# Patient Record
Sex: Female | Born: 1995 | Race: Black or African American | Hispanic: No | Marital: Single | Smoking: Never smoker
Health system: Southern US, Community
[De-identification: ages and names within clinical notes are randomized; demographics above are authoritative.]

## PROBLEM LIST (undated history)

## (undated) DIAGNOSIS — O039 Complete or unspecified spontaneous abortion without complication: Secondary | ICD-10-CM

## (undated) HISTORY — PX: ADENOIDECTOMY: SUR15

---

## 2017-02-09 ENCOUNTER — Encounter: Payer: Self-pay | Admitting: Emergency Medicine

## 2017-02-09 ENCOUNTER — Emergency Department
Admission: EM | Admit: 2017-02-09 | Discharge: 2017-02-09 | Disposition: A | Discharge status: Home / Self Care | Payer: Medicaid Other | Attending: Emergency Medicine | Admitting: Emergency Medicine

## 2017-02-09 ENCOUNTER — Emergency Department: Payer: Medicaid Other

## 2017-02-09 DIAGNOSIS — Y92410 Unspecified street and highway as the place of occurrence of the external cause: Secondary | ICD-10-CM | POA: Diagnosis not present

## 2017-02-09 DIAGNOSIS — Y999 Unspecified external cause status: Secondary | ICD-10-CM | POA: Diagnosis not present

## 2017-02-09 DIAGNOSIS — S199XXA Unspecified injury of neck, initial encounter: Secondary | ICD-10-CM | POA: Diagnosis present

## 2017-02-09 DIAGNOSIS — Y939 Activity, unspecified: Secondary | ICD-10-CM | POA: Diagnosis not present

## 2017-02-09 DIAGNOSIS — S161XXA Strain of muscle, fascia and tendon at neck level, initial encounter: Secondary | ICD-10-CM | POA: Insufficient documentation

## 2017-02-09 DIAGNOSIS — G44319 Acute post-traumatic headache, not intractable: Secondary | ICD-10-CM | POA: Insufficient documentation

## 2017-02-09 MED ORDER — CYCLOBENZAPRINE HCL 10 MG PO TABS: 10.0000 mg | ORAL_TABLET | Freq: Three times a day (TID) | ORAL | 0 refills | Status: DC | PRN

## 2017-02-09 MED ORDER — KETOROLAC TROMETHAMINE 30 MG/ML IJ SOLN
30.0000 mg | Freq: Once | INTRAMUSCULAR | Status: AC
  Administered 2017-02-09: 30 mg via INTRAMUSCULAR
  Filled 2017-02-09: qty 1

## 2017-02-09 MED ORDER — ORPHENADRINE CITRATE 30 MG/ML IJ SOLN
60.0000 mg | Freq: Once | INTRAMUSCULAR | Status: AC
  Administered 2017-02-09: 60 mg via INTRAMUSCULAR
  Filled 2017-02-09: qty 2

## 2017-02-09 MED ORDER — NAPROXEN 500 MG PO TABS: 500.0000 mg | ORAL_TABLET | Freq: Two times a day (BID) | ORAL | 0 refills | Status: DC
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
## 2017-02-09 NOTE — ED Notes (Signed)
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
## 2017-02-11 ENCOUNTER — Emergency Department
Admission: EM | Admit: 2017-02-11 | Discharge: 2017-02-11 | Disposition: A | Discharge status: Home / Self Care | Payer: Medicaid Other | Attending: Emergency Medicine | Admitting: Emergency Medicine

## 2017-02-11 ENCOUNTER — Encounter: Payer: Self-pay | Admitting: Emergency Medicine

## 2017-02-11 DIAGNOSIS — Y999 Unspecified external cause status: Secondary | ICD-10-CM | POA: Diagnosis not present

## 2017-02-11 DIAGNOSIS — Y92410 Unspecified street and highway as the place of occurrence of the external cause: Secondary | ICD-10-CM | POA: Insufficient documentation

## 2017-02-11 DIAGNOSIS — S161XXA Strain of muscle, fascia and tendon at neck level, initial encounter: Secondary | ICD-10-CM | POA: Insufficient documentation

## 2017-02-11 DIAGNOSIS — S199XXA Unspecified injury of neck, initial encounter: Secondary | ICD-10-CM | POA: Diagnosis present

## 2017-02-11 DIAGNOSIS — Y939 Activity, unspecified: Secondary | ICD-10-CM | POA: Insufficient documentation

## 2017-02-11 MED ORDER — IBUPROFEN 600 MG PO TABS: 600.0000 mg | ORAL_TABLET | Freq: Three times a day (TID) | ORAL | 0 refills | Status: DC | PRN

## 2017-02-11 NOTE — ED Notes (Signed)
Pt verbalized understanding of discharge instructions. NAD at this time. 
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
## 2017-02-11 NOTE — Discharge Instructions (Signed)
Call and [facility]. Continue taking ibuprofen 600 mg 3 times a day with food. Use ice or heat to your neck as needed for comfort. Continued to move and drink lots of water to decrease the amount of soreness in your muscles.

## 2018-05-07 ENCOUNTER — Emergency Department
Admission: EM | Admit: 2018-05-07 | Discharge: 2018-05-08 | Disposition: A | Discharge status: Home / Self Care | Payer: Medicaid Other | Attending: Emergency Medicine | Admitting: Emergency Medicine

## 2018-05-07 ENCOUNTER — Other Ambulatory Visit: Payer: Self-pay

## 2018-05-07 ENCOUNTER — Encounter: Payer: Self-pay | Admitting: Emergency Medicine

## 2018-05-07 DIAGNOSIS — O209 Hemorrhage in early pregnancy, unspecified: Secondary | ICD-10-CM | POA: Diagnosis not present

## 2018-05-07 DIAGNOSIS — Z3A00 Weeks of gestation of pregnancy not specified: Secondary | ICD-10-CM | POA: Insufficient documentation

## 2018-05-07 LAB — CBC WITH DIFFERENTIAL/PLATELET
BASOS ABS: 0 10*3/uL (ref 0–0.1)
BASOS PCT: 1 %
EOS ABS: 0.1 10*3/uL (ref 0–0.7)
Eosinophils Relative: 1 %
HEMATOCRIT: 38.4 % (ref 35.0–47.0)
Hemoglobin: 13.1 g/dL (ref 12.0–16.0)
Lymphocytes Relative: 38 %
Lymphs Abs: 2.7 10*3/uL (ref 1.0–3.6)
MCH: 30.4 pg (ref 26.0–34.0)
MCHC: 33.9 g/dL (ref 32.0–36.0)
MCV: 89.5 fL (ref 80.0–100.0)
MONO ABS: 0.5 10*3/uL (ref 0.2–0.9)
Monocytes Relative: 6 %
NEUTROS ABS: 4 10*3/uL (ref 1.4–6.5)
Neutrophils Relative %: 54 %
PLATELETS: 225 10*3/uL (ref 150–440)
RBC: 4.3 MIL/uL (ref 3.80–5.20)
RDW: 13.4 % (ref 11.5–14.5)
WBC: 7.3 10*3/uL (ref 3.6–11.0)

## 2018-05-07 LAB — COMPREHENSIVE METABOLIC PANEL
ALBUMIN: 4.5 g/dL (ref 3.5–5.0)
ALT: 9 U/L — ABNORMAL LOW (ref 14–54)
AST: 18 U/L (ref 15–41)
Alkaline Phosphatase: 55 U/L (ref 38–126)
Anion gap: 9 (ref 5–15)
BILIRUBIN TOTAL: 1.3 mg/dL — AB (ref 0.3–1.2)
BUN: 5 mg/dL — AB (ref 6–20)
CHLORIDE: 104 mmol/L (ref 101–111)
CO2: 26 mmol/L (ref 22–32)
Calcium: 9.6 mg/dL (ref 8.9–10.3)
Creatinine, Ser: 0.64 mg/dL (ref 0.44–1.00)
GFR calc Af Amer: >60 mL/min (ref >60)
GFR calc non Af Amer: >60 mL/min (ref >60)
GLUCOSE: 113 mg/dL — AB (ref 65–99)
POTASSIUM: 3.4 mmol/L — AB (ref 3.5–5.1)
SODIUM: 139 mmol/L (ref 135–145)
Total Protein: 8.1 g/dL (ref 6.5–8.1)

## 2018-05-07 NOTE — ED Notes (Signed)
Pt to the er for vaginal bleeding x 3 weeks. Pt is no

## 2018-05-07 NOTE — ED Triage Notes (Addendum)
Patient ambulatory to triage with steady gait, without difficulty or distress noted; pt reports vag bleeding x 3 wks; miscarriage 2mos ago

## 2018-05-07 NOTE — ED Provider Notes (Signed)
[facility] Emergency Department Provider Note  ____________________________________________  Time seen: Approximately 11:24 PM  I have reviewed the triage vital signs and the nursing notes.   HISTORY  Chief Complaint Vaginal Bleeding    HPI Brittany Boone is a 22 y.o. female who presents to the emergency department for treatment and evaluation of vaginal bleeding for the past  3 weeks.  Patient had a spontaneous abortion in April and was [facility].  She was given misoprostol.  She states that she had stopped bleeding until 3 weeks ago.  The amount of bleeding varies.  At times, the bleeding is scant.  She states that it seems the bleeding is worse at night.  She has passed some small clots but nothing very large.  Blood is dark red.  She denies any abdominal pain or cramping.  She denies any new or changes in medication.  She has not had a new sexual partner.  She is using condoms for birth control.  History reviewed. No pertinent past medical history.  There are no active problems to display for this patient.   Past Surgical History:  Procedure Laterality Date  . ADENOIDECTOMY      Prior to Admission medications   Medication Sig Start Date End Date Taking? Authorizing Provider  ibuprofen (ADVIL,MOTRIN) 600 MG tablet Take 1 tablet (600 mg total) by mouth every 8 (eight) hours as needed. 02/11/17   Tommi RumpsSummers, Rhonda L, PA-C    Allergies Patient has no known allergies.  No family history on file.  Social History Social History   Tobacco Use  . Smoking status: Never Smoker  . Smokeless tobacco: Never Used  Substance Use Topics  . Alcohol use: No  . Drug use: Yes    Types: Marijuana    Comment: 1-2x per day.    Review of Systems Constitutional: Negative for fever. Respiratory: Negative for shortness of breath or cough. Gastrointestinal: Negative for abdominal pain; negative for nausea , negative for  vomiting. Genitourinary: Negative for dysuria , negative for vaginal discharge.  Positive for vaginal bleeding. Musculoskeletal: Negative for back pain. Skin: Negative for acute skin changes/rash/lesion. ____________________________________________   PHYSICAL EXAM:  VITAL SIGNS: ED Triage Vitals [05/07/18 2202]  Enc Vitals Group     BP 112/80     Pulse Rate 81     Resp 18     Temp 98 F (36.7 C)     Temp Source Oral     SpO2 100 %     Weight 111 lb (50.3 kg)     Height 5\' 3"  (1.6 m)     Head Circumference      Peak Flow      Pain Score 0     Pain Loc      Pain Edu?      Excl. in GC?     Constitutional: Alert and oriented. Well appearing and in no acute distress. Eyes: Conjunctivae are normal. Head: Atraumatic. Nose: No congestion/rhinnorhea. Mouth/Throat: Mucous membranes are moist. Respiratory: Normal respiratory effort.  No retractions. Gastrointestinal: Bowel sounds active x 4; Abdomen is soft without rebound or guarding. Genitourinary: Pelvic exam: Cervix appears healthy.  Cervical os is closed.  No discharge noted.  There is a small amount of dark red blood in the vaginal vault.  Bimanual exam is normal Musculoskeletal: No extremity tenderness nor edema.  Neurologic:  Normal speech and language. No gross focal neurologic deficits are appreciated. Speech is normal. No gait instability. Skin:  Skin  is warm, dry and intact. No rash noted on exposed skin. Psychiatric: Mood and affect are normal. Speech and behavior are normal.  ____________________________________________   LABS (all labs ordered are listed, but only abnormal results are displayed)  Labs Reviewed  WET PREP, GENITAL - Abnormal; Notable for the following components:      Result Value   WBC, Wet Prep HPF POC FEW (*)    All other components within normal limits  URINALYSIS, COMPLETE (UACMP) WITH MICROSCOPIC - Abnormal; Notable for the following components:   Color, Urine RED (*)    APPearance CLOUDY  (*)    Hgb urine dipstick LARGE (*)    Protein, ur 100 (*)    Leukocytes, UA TRACE (*)    RBC / HPF >50 (*)    All other components within normal limits  COMPREHENSIVE METABOLIC PANEL - Abnormal; Notable for the following components:   Potassium 3.4 (*)    Glucose, Bld 113 (*)    BUN 5 (*)    ALT 9 (*)    Total Bilirubin 1.3 (*)    All other components within normal limits  HCG, QUANTITATIVE, PREGNANCY - Abnormal; Notable for the following components:   hCG, Beta Chain, Quant, S 455 (*)    All other components within normal limits  POCT PREGNANCY, URINE - Abnormal; Notable for the following components:   Preg Test, Ur POSITIVE (*)    All other components within normal limits  CHLAMYDIA/NGC RT PCR ([facility] ONLY)  CBC WITH DIFFERENTIAL/PLATELET  ABO/RH   ____________________________________________  RADIOLOGY  Pending ____________________________________________  Procedures  ____________________________________________  22 year old female who presents to the emergency department for treatment and evaluation of painless vaginal bleeding.  She states that there is a possibility that she could be pregnant.  Although she states that she has been bleeding for 3 weeks, she is not feeling weak or dizzy.  She has no known history of anemia.  Labs and ultrasound will be completed.  ----------------------------------------- 1:36 AM on 05/08/2018 -----------------------------------------  Patient was notified that the urine pregnancy test was positive.  ABO and Rh has been ordered.  Beta hCG is still pending. OB ultrasound requested.   ----------------------------------------- 2:28 AM on 05/08/2018 -----------------------------------------  Patient care transferred to Dr. Manson Passey.  He will follow up on the ultrasound and discuss the plan of care with patient.   INITIAL IMPRESSION / ASSESSMENT AND PLAN / ED COURSE  Pertinent labs & imaging results that were available during  my care of the patient were reviewed by me and considered in my medical decision making (see chart for details).  ____________________________________________   FINAL CLINICAL IMPRESSION(S) / ED DIAGNOSES  Final diagnoses:  Vaginal bleeding in pregnancy, first trimester    Note:  This document was prepared using Dragon voice recognition software and may include unintentional dictation errors.    Chinita Pester, FNP 05/08/18 2018    Darci Current, MD 05/08/18 [phone]

## 2018-05-08 ENCOUNTER — Other Ambulatory Visit: Payer: Self-pay

## 2018-05-08 ENCOUNTER — Encounter: Payer: Self-pay | Admitting: Radiology

## 2018-05-08 ENCOUNTER — Emergency Department: Payer: Medicaid Other

## 2018-05-08 LAB — CHLAMYDIA/NGC RT PCR (ARMC ONLY)
CHLAMYDIA TR: NOT DETECTED
N GONORRHOEAE: NOT DETECTED

## 2018-05-08 LAB — URINALYSIS, COMPLETE (UACMP) WITH MICROSCOPIC
BACTERIA UA: NONE SEEN
Bilirubin Urine: NEGATIVE
GLUCOSE, UA: NEGATIVE mg/dL
KETONES UR: NEGATIVE mg/dL
NITRITE: NEGATIVE
PH: 6 (ref 5.0–8.0)
PROTEIN: 100 mg/dL — AB
RBC / HPF: >50 RBC/hpf — ABNORMAL HIGH (ref 0–5)
Specific Gravity, Urine: 1.019 (ref 1.005–1.030)

## 2018-05-08 LAB — WET PREP, GENITAL
CLUE CELLS WET PREP: NONE SEEN
Sperm: NONE SEEN
TRICH WET PREP: NONE SEEN
Yeast Wet Prep HPF POC: NONE SEEN

## 2018-05-08 LAB — POCT PREGNANCY, URINE: Preg Test, Ur: POSITIVE — AB

## 2018-05-08 LAB — ABO/RH: ABO/RH(D): O POS

## 2018-05-08 LAB — HCG, QUANTITATIVE, PREGNANCY: hCG, Beta Chain, Quant, S: 455 m[IU]/mL — ABNORMAL HIGH (ref <5)

## 2018-05-08 NOTE — ED Notes (Signed)
Patient transported to Ultrasound 

## 2018-05-08 NOTE — ED Provider Notes (Signed)
I assumed care of the patient from Triplett NP at 2:00 AM.  Patient presented with vaginal bleeding with new diagnosis of pregnancy blood type a positive hCG quantitative 455.  Ultrasound will be did not reveal any evidence of an intrauterine pregnancy which may be secondary to early gestation versus potential other etiologies.  Spoke with the patient at length regarding the possibility of impending miscarriage versus other etiologies of first trimester bleeding and stressed the importance of the patient follow-up with OB/GYN.   Darci CurrentBrown, Barton N, MD 05/08/18 [phone]

## 2018-05-08 NOTE — ED Notes (Signed)
Blood sent to lab

## 2018-05-10 ENCOUNTER — Other Ambulatory Visit: Payer: Self-pay

## 2018-05-10 ENCOUNTER — Encounter: Payer: Self-pay | Admitting: Obstetrics and Gynecology

## 2018-05-10 ENCOUNTER — Emergency Department
Admission: EM | Admit: 2018-05-10 | Discharge: 2018-05-10 | Disposition: A | Discharge status: Home / Self Care | Payer: Medicaid Other | Attending: Emergency Medicine | Admitting: Emergency Medicine

## 2018-05-10 DIAGNOSIS — E349 Endocrine disorder, unspecified: Secondary | ICD-10-CM

## 2018-05-10 DIAGNOSIS — N938 Other specified abnormal uterine and vaginal bleeding: Secondary | ICD-10-CM | POA: Diagnosis not present

## 2018-05-10 DIAGNOSIS — Z3202 Encounter for pregnancy test, result negative: Secondary | ICD-10-CM | POA: Insufficient documentation

## 2018-05-10 DIAGNOSIS — N939 Abnormal uterine and vaginal bleeding, unspecified: Secondary | ICD-10-CM

## 2018-05-10 LAB — HCG, QUANTITATIVE, PREGNANCY: HCG, BETA CHAIN, QUANT, S: 253 m[IU]/mL — AB (ref <5)

## 2018-05-10 NOTE — ED Notes (Signed)
Pt reports she is here for beta redraw test and has some slight spotting

## 2018-05-10 NOTE — ED Provider Notes (Signed)
[facility] Emergency Department Provider Note  ____________________________________________  Time seen: Approximately 3:36 PM  I have reviewed the triage vital signs and the nursing notes.   HISTORY  Chief Complaint Follow-up    HPI Brittany Boone is a 22 y.o. female who presents the emergency department for repeat hCG.  Patient was seen 3 days ago, had extensive work-up.  At that time, patient's ECG showed that she was pregnant, no viable intrauterine pregnancy was identified on ultrasound.  Patient had unexplained vaginal bleeding for 3 weeks.  Due to hCG level, with negative ultrasound, she was advised to follow-up for repeat beta hCG testing.  Pending results whether this was increasing or decreasing when determine further care.  Patient reports that the bleeding has drastically improved when compared to 3 days ago.  She is only "spotting" now.  She denies any abdominal pain, pelvic pain, dysuria, polyuria, hematuria, diarrhea or constipation.  Patient has not take any medications.  She denies being under the care of an OB/GYN.  She does report that she had a previous miscarriage 2 months prior.    History reviewed. No pertinent past medical history.  There are no active problems to display for this patient.   Past Surgical History:  Procedure Laterality Date  . ADENOIDECTOMY      Prior to Admission medications   Medication Sig Start Date End Date Taking? Authorizing Provider  ibuprofen (ADVIL,MOTRIN) 600 MG tablet Take 1 tablet (600 mg total) by mouth every 8 (eight) hours as needed. 02/11/17   Tommi Rumps, PA-C    Allergies Patient has no known allergies.  History reviewed. No pertinent family history.  Social History Social History   Tobacco Use  . Smoking status: Never Smoker  . Smokeless tobacco: Never Used  Substance Use Topics  . Alcohol use: No  . Drug use: Yes    Types: Marijuana    Comment: 1-2x per day.     Review of  Systems  Constitutional: No fever/chills Eyes: No visual changes.  Cardiovascular: no chest pain. Respiratory: no cough. No SOB. Gastrointestinal: No abdominal pain.  No nausea, no vomiting.  No diarrhea.  No constipation. Genitourinary: Negative for dysuria. No hematuria.  Positive for spotting.  No vaginal discharge. Musculoskeletal: Negative for musculoskeletal pain. Skin: Negative for rash, abrasions, lacerations, ecchymosis. Neurological: Negative for headaches, focal weakness or numbness. 10-point ROS otherwise negative.  ____________________________________________   PHYSICAL EXAM:  VITAL SIGNS: ED Triage Vitals  Enc Vitals Group     BP 05/10/18 1425 117/68     Pulse Rate 05/10/18 1425 70     Resp 05/10/18 1425 20     Temp 05/10/18 1425 98.4 F (36.9 C)     Temp Source 05/10/18 1425 Oral     SpO2 05/10/18 1425 99 %     Weight 05/10/18 1426 111 lb (50.3 kg)     Height 05/10/18 1426 5\' 3"  (1.6 m)     Head Circumference --      Peak Flow --      Pain Score 05/10/18 1425 0     Pain Loc --      Pain Edu? --      Excl. in GC? --      Constitutional: Alert and oriented. Well appearing and in no acute distress. Eyes: Conjunctivae are normal. PERRL. EOMI. Head: Atraumatic. Neck: No stridor.    Cardiovascular: Normal rate, regular rhythm. Normal S1 and S2.  Good peripheral circulation. Respiratory: Normal respiratory effort without tachypnea  or retractions. Lungs CTAB. Good air entry to the bases with no decreased or absent breath sounds. Gastrointestinal: Bowel sounds 4 quadrants. Soft and nontender to palpation. No guarding or rigidity. No palpable masses. No distention. No CVA tenderness. Musculoskeletal: Full range of motion to all extremities. No gross deformities appreciated. Neurologic:  Normal speech and language. No gross focal neurologic deficits are appreciated.  Skin:  Skin is warm, dry and intact. No rash noted. Psychiatric: Mood and affect are normal.  Speech and behavior are normal. Patient exhibits appropriate insight and judgement.   ____________________________________________   LABS (all labs ordered are listed, but only abnormal results are displayed)  Labs Reviewed  HCG, QUANTITATIVE, PREGNANCY - Abnormal; Notable for the following components:      Result Value   hCG, Beta Chain, Quant, S 253 (*)    All other components within normal limits   ____________________________________________  EKG   ____________________________________________  RADIOLOGY   No results found.  ____________________________________________    PROCEDURES  Procedure(s) performed:    Procedures    Medications - No data to display   ____________________________________________   INITIAL IMPRESSION / ASSESSMENT AND PLAN / ED COURSE  Pertinent labs & imaging results that were available during my care of the patient were reviewed by me and considered in my medical decision making (see chart for details).  Review of the [place] CSRS was performed in accordance of the [facility] prior to dispensing any controlled drugs.      Patient's diagnosis is consistent with vaginal bleeding.  Patient presents the emergency department for repeat beta hCG.  Previous hCG was 455, today it is 253.  Given patient's symptoms, it is difficult to ascertain whether the patient had a miscarriage versus chemical pregnancy.  Symptoms are improving, she currently denies any abdominal, pelvic pain.  At this time, no indication for repeat pelvic exam, repeat labs, repeat ultrasound.  I have informed the patient of her results.  Given the fact that patient had a miscarriage 2 months ago, this episode of vaginal bleeding which may or may not be a miscarriage, she is advised to follow-up with OB/GYN for further evaluation and management.  Patient verbalizes understanding of same.  At this time, no prescriptions.  Patient is highly encouraged to follow-up with OB/GYN..Patient is  given ED precautions to return to the ED for any worsening or new symptoms.     ____________________________________________  FINAL CLINICAL IMPRESSION(S) / ED DIAGNOSES  Final diagnoses:  Vaginal bleeding  Elevated serum hCG in female, not pregnant      NEW MEDICATIONS STARTED DURING THIS VISIT:  ED Discharge Orders    None          This chart was dictated using voice recognition software/Dragon. Despite best efforts to proofread, errors can occur which can change the meaning. Any change was purely unintentional.    Racheal PatchesCuthriell, Damien Cisar D, PA-C 05/10/18 1543    Merrily Brittleifenbark, Neil, MD 05/10/18 [phone]

## 2018-05-10 NOTE — ED Triage Notes (Signed)
Pt reports that she was here three days ago and was told to come back and get a redraw of her Beta to see if it is going up or down. Pt states that she is still bleeding but it is only spotting now.

## 2018-05-13 ENCOUNTER — Other Ambulatory Visit: Payer: Self-pay

## 2018-05-13 ENCOUNTER — Emergency Department: Payer: Medicaid Other

## 2018-05-13 ENCOUNTER — Emergency Department
Admission: EM | Admit: 2018-05-13 | Discharge: 2018-05-13 | Disposition: A | Discharge status: Home / Self Care | Payer: Medicaid Other | Attending: Emergency Medicine | Admitting: Emergency Medicine

## 2018-05-13 ENCOUNTER — Encounter: Payer: Self-pay | Admitting: Emergency Medicine

## 2018-05-13 DIAGNOSIS — Y999 Unspecified external cause status: Secondary | ICD-10-CM | POA: Diagnosis not present

## 2018-05-13 DIAGNOSIS — O039 Complete or unspecified spontaneous abortion without complication: Secondary | ICD-10-CM | POA: Diagnosis not present

## 2018-05-13 DIAGNOSIS — Y939 Activity, unspecified: Secondary | ICD-10-CM | POA: Insufficient documentation

## 2018-05-13 DIAGNOSIS — R51 Headache: Secondary | ICD-10-CM | POA: Diagnosis not present

## 2018-05-13 DIAGNOSIS — Z3A00 Weeks of gestation of pregnancy not specified: Secondary | ICD-10-CM | POA: Diagnosis not present

## 2018-05-13 DIAGNOSIS — M79644 Pain in right finger(s): Secondary | ICD-10-CM | POA: Insufficient documentation

## 2018-05-13 DIAGNOSIS — Y929 Unspecified place or not applicable: Secondary | ICD-10-CM | POA: Diagnosis not present

## 2018-05-13 DIAGNOSIS — S0990XA Unspecified injury of head, initial encounter: Secondary | ICD-10-CM

## 2018-05-13 DIAGNOSIS — S161XXA Strain of muscle, fascia and tendon at neck level, initial encounter: Secondary | ICD-10-CM

## 2018-05-13 DIAGNOSIS — M542 Cervicalgia: Secondary | ICD-10-CM | POA: Insufficient documentation

## 2018-05-13 DIAGNOSIS — S63601A Unspecified sprain of right thumb, initial encounter: Secondary | ICD-10-CM

## 2018-05-13 DIAGNOSIS — O9989 Other specified diseases and conditions complicating pregnancy, childbirth and the puerperium: Secondary | ICD-10-CM | POA: Diagnosis present

## 2018-05-13 HISTORY — DX: Complete or unspecified spontaneous abortion without complication: O03.9

## 2018-05-13 LAB — HCG, QUANTITATIVE, PREGNANCY: hCG, Beta Chain, Quant, S: 175 m[IU]/mL — ABNORMAL HIGH (ref <5)

## 2018-05-13 MED ORDER — ACETAMINOPHEN 325 MG PO TABS
650.0000 mg | ORAL_TABLET | Freq: Once | ORAL | Status: AC
  Administered 2018-05-13: 650 mg via ORAL
  Filled 2018-05-13: qty 2

## 2018-05-13 NOTE — ED Notes (Signed)
Labs sent at this time.
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
## 2018-05-13 NOTE — ED Notes (Signed)
Patient transported to X-ray 
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
## 2018-05-13 NOTE — ED Notes (Signed)
AAOx3.  Skin warm and dry.  NAD 

## 2018-07-02 ENCOUNTER — Emergency Department
Admission: EM | Admit: 2018-07-02 | Discharge: 2018-07-02 | Disposition: A | Discharge status: Home / Self Care | Payer: Medicaid Other | Attending: Emergency Medicine | Admitting: Emergency Medicine

## 2018-07-02 ENCOUNTER — Other Ambulatory Visit: Payer: Self-pay

## 2018-07-02 DIAGNOSIS — H579 Unspecified disorder of eye and adnexa: Secondary | ICD-10-CM

## 2018-07-02 DIAGNOSIS — H5789 Other specified disorders of eye and adnexa: Secondary | ICD-10-CM

## 2018-07-02 DIAGNOSIS — H1132 Conjunctival hemorrhage, left eye: Secondary | ICD-10-CM | POA: Diagnosis not present

## 2018-07-02 DIAGNOSIS — H5712 Ocular pain, left eye: Secondary | ICD-10-CM

## 2018-07-02 MED ORDER — ERYTHROMYCIN 5 MG/GM OP OINT
TOPICAL_OINTMENT | Freq: Once | OPHTHALMIC | Status: AC
  Administered 2018-07-02: 1 via OPHTHALMIC
  Filled 2018-07-02: qty 1

## 2018-07-02 MED ORDER — FLUORESCEIN SODIUM 1 MG OP STRP
ORAL_STRIP | OPHTHALMIC | Status: AC
  Filled 2018-07-02: qty 1

## 2018-07-02 MED ORDER — ERYTHROMYCIN 5 MG/GM OP OINT: 1.0000 "application " | TOPICAL_OINTMENT | Freq: Three times a day (TID) | OPHTHALMIC | 0 refills | Status: AC

## 2018-07-02 MED ORDER — TETRACAINE HCL 0.5 % OP SOLN
OPHTHALMIC | Status: AC
  Filled 2018-07-02: qty 4
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
## 2018-07-02 NOTE — Discharge Instructions (Signed)
Please follow-up with the eye doctor for further evaluation of your eye injury.

## 2018-10-02 ENCOUNTER — Encounter: Payer: Medicaid Other | Admitting: Advanced Practice Midwife

## 2018-10-16 ENCOUNTER — Encounter: Payer: Self-pay | Admitting: Certified Nurse Midwife

## 2018-10-17 ENCOUNTER — Encounter: Payer: Self-pay | Admitting: Certified Nurse Midwife

## 2018-10-24 ENCOUNTER — Emergency Department
Admission: EM | Admit: 2018-10-24 | Discharge: 2018-10-24 | Disposition: A | Discharge status: Home / Self Care | Payer: Medicaid Other | Attending: Emergency Medicine | Admitting: Emergency Medicine

## 2018-10-24 ENCOUNTER — Other Ambulatory Visit: Payer: Self-pay

## 2018-10-24 ENCOUNTER — Encounter: Payer: Self-pay | Admitting: Emergency Medicine

## 2018-10-24 DIAGNOSIS — F1210 Cannabis abuse, uncomplicated: Secondary | ICD-10-CM | POA: Insufficient documentation

## 2018-10-24 DIAGNOSIS — O9A319 Physical abuse complicating pregnancy, unspecified trimester: Secondary | ICD-10-CM | POA: Insufficient documentation

## 2018-10-24 DIAGNOSIS — Y998 Other external cause status: Secondary | ICD-10-CM | POA: Diagnosis not present

## 2018-10-24 DIAGNOSIS — Y929 Unspecified place or not applicable: Secondary | ICD-10-CM | POA: Insufficient documentation

## 2018-10-24 DIAGNOSIS — Y9389 Activity, other specified: Secondary | ICD-10-CM | POA: Insufficient documentation

## 2018-10-24 DIAGNOSIS — Z3A00 Weeks of gestation of pregnancy not specified: Secondary | ICD-10-CM | POA: Diagnosis not present

## 2018-10-24 DIAGNOSIS — R51 Headache: Secondary | ICD-10-CM | POA: Diagnosis not present

## 2018-10-24 DIAGNOSIS — S0081XA Abrasion of other part of head, initial encounter: Secondary | ICD-10-CM | POA: Diagnosis not present

## 2018-10-24 MED ORDER — ACETAMINOPHEN 500 MG PO TABS
1000.0000 mg | ORAL_TABLET | Freq: Once | ORAL | Status: AC
  Administered 2018-10-24: 1000 mg via ORAL
  Filled 2018-10-24: qty 2
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
## 2019-11-28 IMAGING — US US OB COMP LESS 14 WK
1 series · 13 of 28 positions shown · non-contrast
Comparison: None.

CLINICAL DATA: Vaginal bleeding for 3 weeks. History of miscarriage
2 months ago. Estimated gestational age by LMP is 3 weeks 4 days.
Positive urine pregnancy test with quantitative beta hCG 455.

EXAM:
OBSTETRIC <14 WK US AND TRANSVAGINAL OB US
TECHNIQUE: Both transabdominal and transvaginal ultrasound examinations were
performed for complete evaluation of the gestation as well as the
maternal uterus, adnexal regions, and pelvic cul-de-sac.
Transvaginal technique was performed to assess early pregnancy.

[Series 1: us ob comp less 14 wk · 0.20mm/px · 13 of 88 slices shown]
[im 4/88]
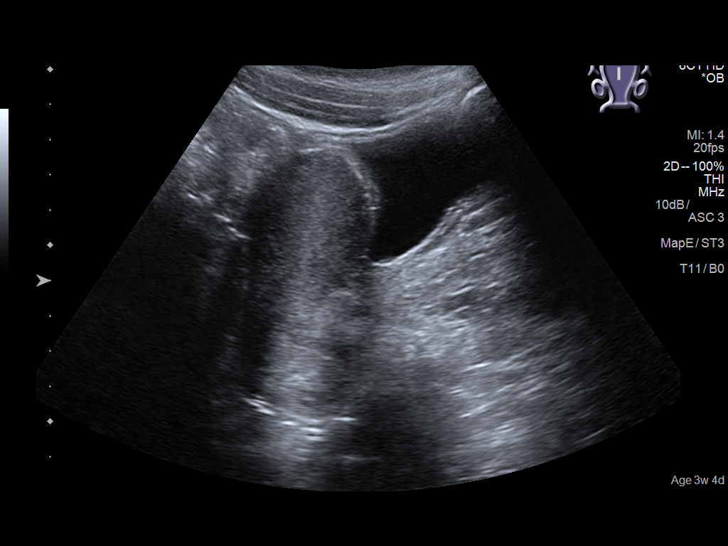
[im 10/88]
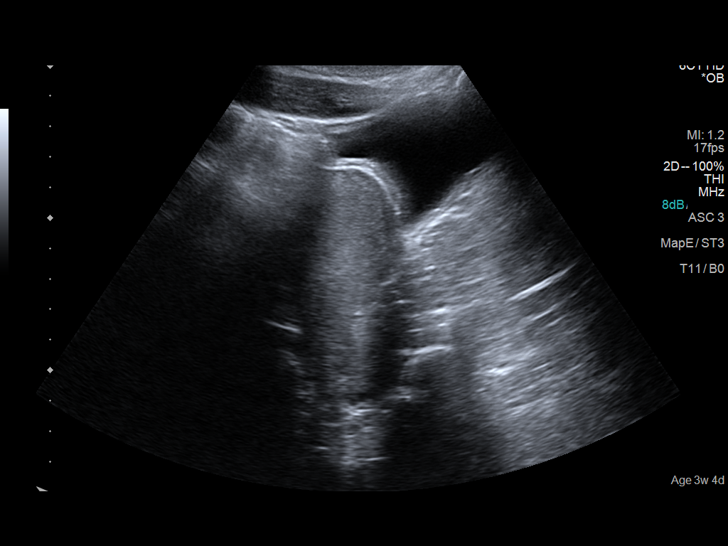
[im 17/88]
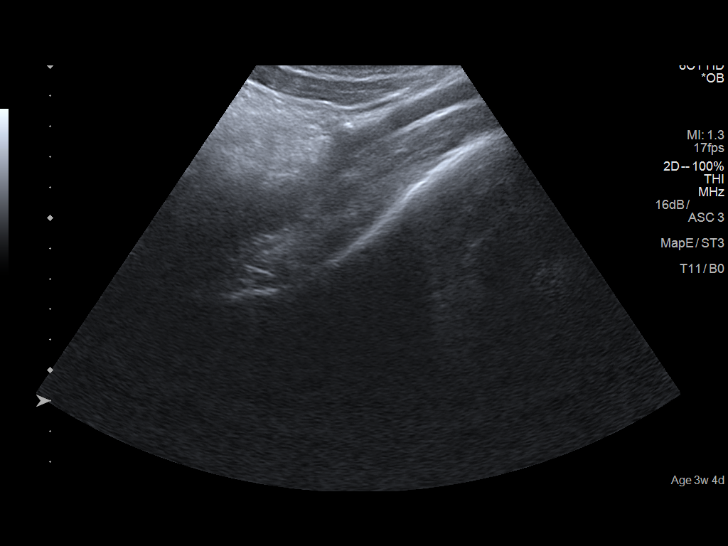
[im 23/88]
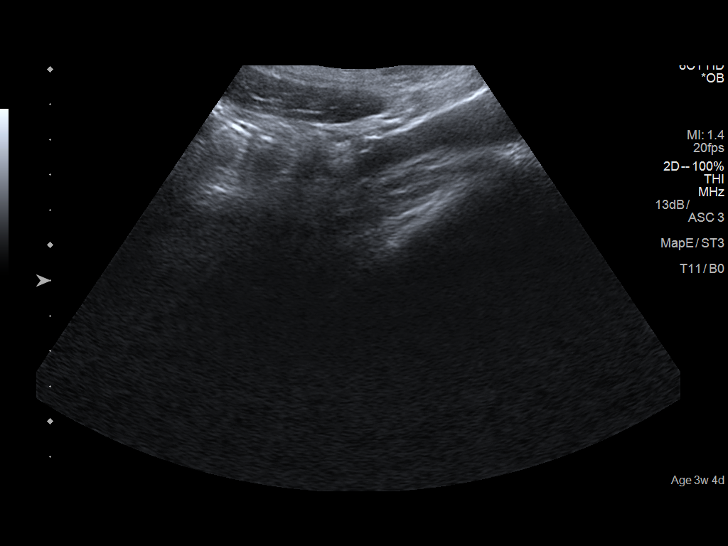
[im 30/88]
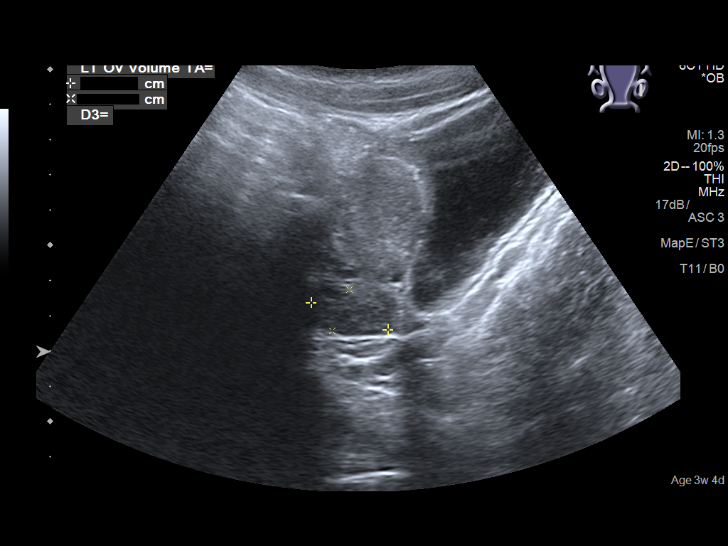
[im 36/88]
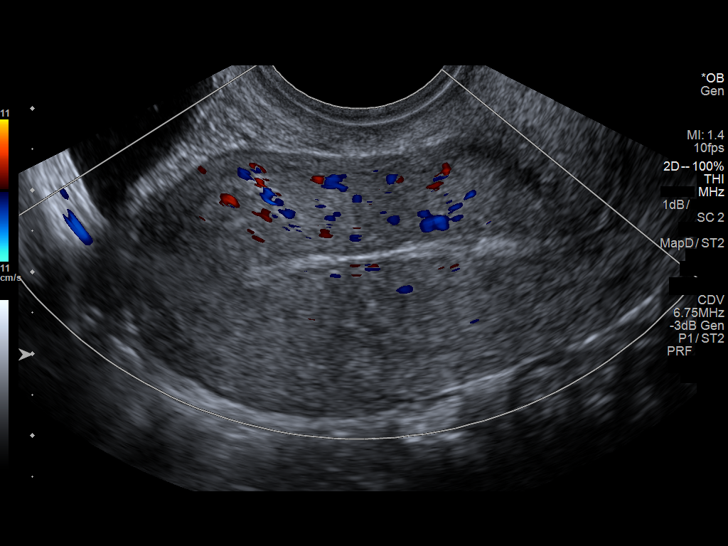
[im 46/88]
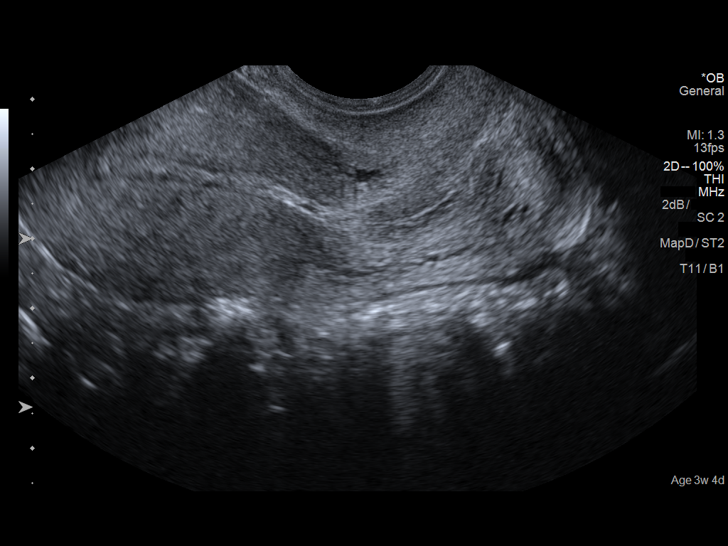
[im 52/88]
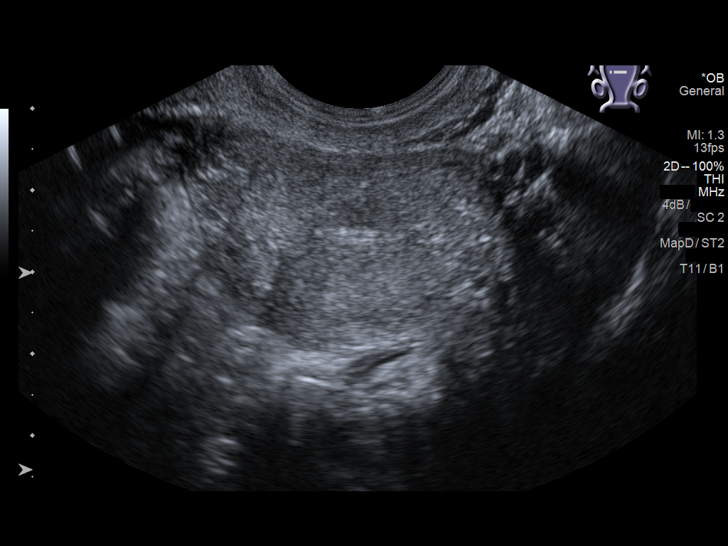
[im 59/88]
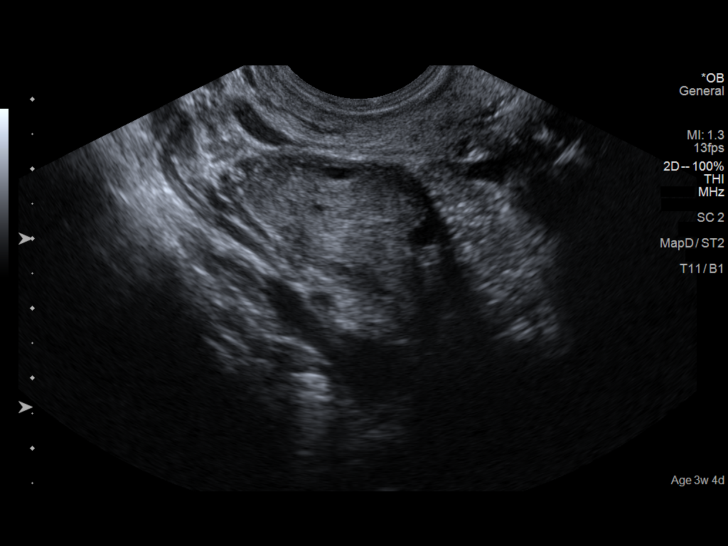
[im 65/88]
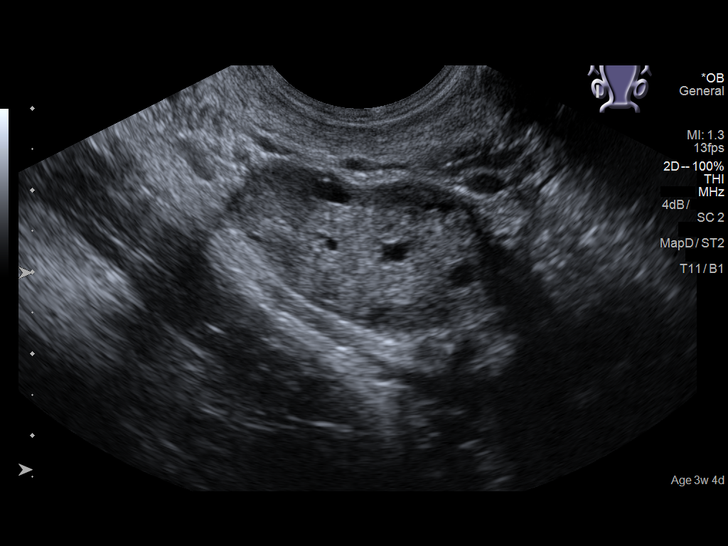
[im 71/88]
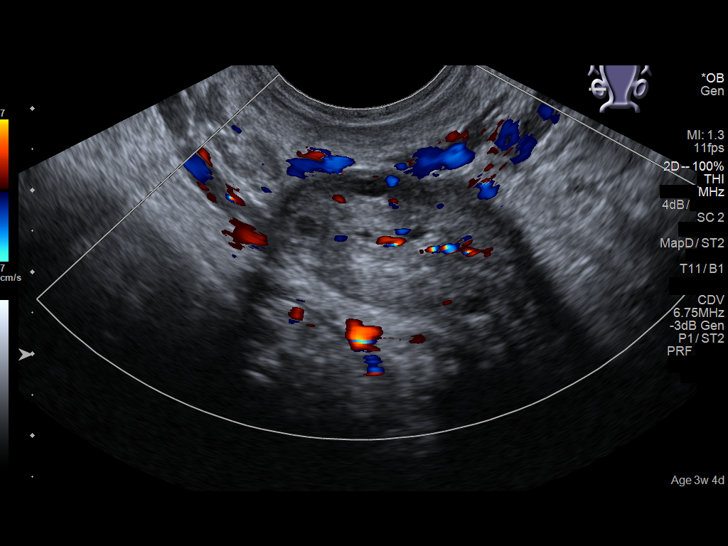
[im 78/88]
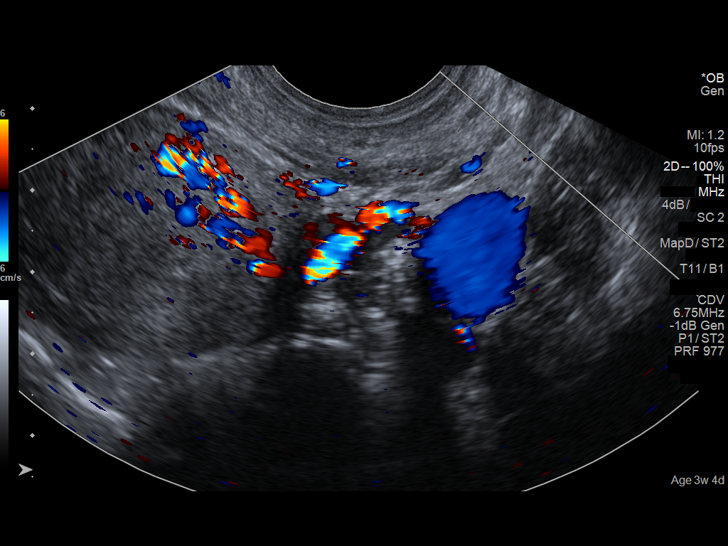
[im 84/88]
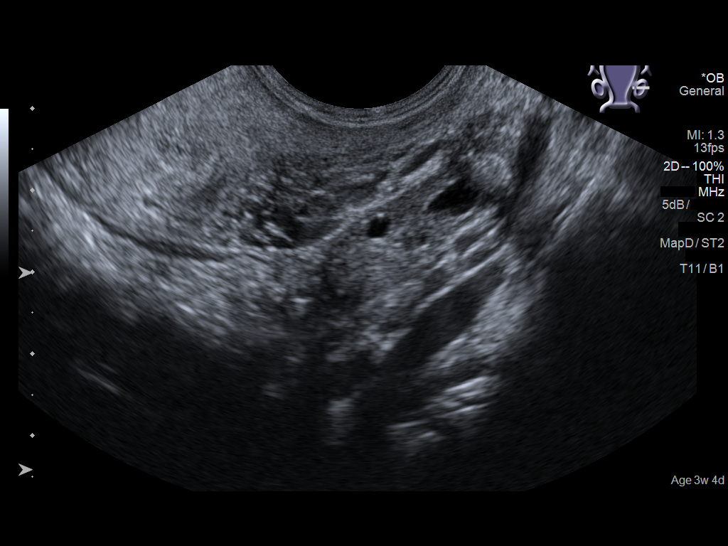

[13 of 28 positions shown; findings below may reference images not displayed]

FINDINGS: Intrauterine gestational sac: None

Yolk sac:  Not Visualized.

Embryo:  Not Visualized.

Cardiac Activity: Not Visualized.

Maternal uterus/adnexae: The uterus is anteverted and measures 3.2 x
6.8 x 4.3 cm. No myometrial mass lesions identified. Small nabothian
cysts in the cervix. Endometrial stripe thickness is normal,
measuring about 4 mm. No endometrial fluid collections. Both ovaries
are visualized and appear normal. Normal follicular changes are
demonstrated. No abnormal adnexal masses. No abnormal pelvic fluid.
IMPRESSION: No intrauterine gestational sac, yolk sac, or fetal pole identified.
Differential considerations include intrauterine pregnancy too early
to be sonographically visualized, missed abortion, or ectopic
pregnancy. Followup ultrasound is recommended in 10-14 days for
further evaluation.
